# Patient Record
Sex: Female | Born: 1965 | Race: White | Hispanic: No | Marital: Married | Smoking: Never smoker
Health system: Southern US, Community
[De-identification: ages and names within clinical notes are randomized; demographics above are authoritative.]

## PROBLEM LIST (undated history)

## (undated) DIAGNOSIS — M1990 Unspecified osteoarthritis, unspecified site: Secondary | ICD-10-CM

## (undated) DIAGNOSIS — F988 Other specified behavioral and emotional disorders with onset usually occurring in childhood and adolescence: Secondary | ICD-10-CM

## (undated) DIAGNOSIS — G2581 Restless legs syndrome: Secondary | ICD-10-CM

## (undated) DIAGNOSIS — M797 Fibromyalgia: Secondary | ICD-10-CM

## (undated) DIAGNOSIS — T7840XA Allergy, unspecified, initial encounter: Secondary | ICD-10-CM

## (undated) HISTORY — PX: ABDOMINAL HYSTERECTOMY: SHX81

## (undated) HISTORY — DX: Restless legs syndrome: G25.81

## (undated) HISTORY — DX: Allergy, unspecified, initial encounter: T78.40XA

## (undated) HISTORY — DX: Unspecified osteoarthritis, unspecified site: M19.90

## (undated) HISTORY — PX: NOSE SURGERY: SHX723

## (undated) HISTORY — PX: NEURECTOMY FOOT: SUR888

## (undated) HISTORY — DX: Other specified behavioral and emotional disorders with onset usually occurring in childhood and adolescence: F98.8

## (undated) HISTORY — PX: SHOULDER ARTHROSCOPY: SHX128

## (undated) HISTORY — DX: Fibromyalgia: M79.7

---

## 1998-09-06 ENCOUNTER — Encounter (HOSPITAL_COMMUNITY): Admission: RE | Admit: 1998-09-06 | Discharge: 1998-12-05 | Payer: Self-pay | Admitting: Psychiatry

## 1998-11-26 ENCOUNTER — Emergency Department (HOSPITAL_COMMUNITY): Admission: EM | Admit: 1998-11-26 | Discharge: 1998-11-26 | Payer: Self-pay | Admitting: Emergency Medicine

## 2000-01-28 ENCOUNTER — Other Ambulatory Visit: Admission: RE | Admit: 2000-01-28 | Discharge: 2000-01-28 | Payer: Self-pay | Admitting: Obstetrics and Gynecology

## 2000-03-21 ENCOUNTER — Encounter (INDEPENDENT_AMBULATORY_CARE_PROVIDER_SITE_OTHER): Payer: Self-pay

## 2000-03-21 ENCOUNTER — Other Ambulatory Visit: Admission: RE | Admit: 2000-03-21 | Discharge: 2000-03-21 | Payer: Self-pay | Admitting: Obstetrics and Gynecology

## 2000-04-04 ENCOUNTER — Other Ambulatory Visit: Admission: RE | Admit: 2000-04-04 | Discharge: 2000-04-04 | Payer: Self-pay | Admitting: Gastroenterology

## 2000-12-03 ENCOUNTER — Encounter (INDEPENDENT_AMBULATORY_CARE_PROVIDER_SITE_OTHER): Payer: Self-pay

## 2000-12-04 ENCOUNTER — Inpatient Hospital Stay (HOSPITAL_COMMUNITY): Admission: AD | Admit: 2000-12-04 | Discharge: 2000-12-05 | Payer: Self-pay | Admitting: Obstetrics and Gynecology

## 2009-05-22 ENCOUNTER — Encounter: Admission: RE | Admit: 2009-05-22 | Discharge: 2009-05-22 | Payer: Self-pay | Admitting: Gastroenterology

## 2011-03-08 NOTE — H&P (Signed)
[facility]  Patient:    Morgan Adkins, Morgan Adkins                          MRN: [id number] Adm. Date:  12/03/00 Dictator:   Lenoard Aden, M.D. [facility]   History and Physical  CHIEF COMPLAINT:              Worsening dysmenorrhea, menorrhagia and symptomatic uterine fibroids.  HISTORY OF PRESENT ILLNESS:   The patient is a 45 year old white female G3, P2, with a history of depression, deep dyspareunia and menorrhagia, dysmenorrhea, who presents for definitive therapy.  She has had a long-standing history of worsening dysmenorrhea and menorrhagia which improved on birth control pills over a six- to eight-month period.  However, her hormonal contraception made her depression more refractory to treatment despite numerous changes in her antidepressive therapy.  She had to discontinue birth control pills because of worsening of her depression.  She continues with symptomatic discomfort to include menorrhagia and dysmenorrhea. At this time she has known uterine fibroids in addition to a family history of endometriosis.  PAST MEDICAL HISTORY:         Remarkable for anemia, heartburn, PMS, remote history of Chlamydia infection, and septoplasty, stomach surgery for reflux in 1996.  FAMILY HISTORY:               She has a family history of endometriosis and uterine cancer.  SOCIAL HISTORY:               She is married, living with her spouse and children.  She is a Community education officer.  MEDICATION HISTORY:           Includes the use of Celexa, Allegra and previously Mircette which has been discontinued.  ALLERGIES:                    She has allergies to CODEINE.  PREGNANCY HISTORY:            Remarkable for two uncomplicated vaginal deliveries.  REVIEW OF SYSTEMS:            Otherwise negative.  PHYSICAL EXAMINATION:  GENERAL:                      She is a well-developed, well-nourished white female in no apparent distress.  VITAL SIGNS:                   Blood pressure of 103/64, weight of 157 pounds, height of 5 feet 4 inches.  HEENT:                        Normal.  LUNGS:                        Clear.  HEART:                        Regular rate and rhythm.  ABDOMEN:                      Soft, scaphoid and nontender.  PELVIC EXAM:                  Reveals a retroflexed uterus which is enlarged and irregular.  No adnexal masses are appreciated.  EXTREMITIES:  Reveal no cords.  NEUROLOGIC EXAM:              Nonfocal.  IMPRESSION:                   1. Refractory dysmenorrhea, menorrhagia with                                  worsening dyspareunia.                               2. Depression.  The patient not a candidate for                                  hormonal therapy due to exacerbation of                                  depression.  PLAN:                         The plan is to proceed with definitive therapy in the form of a laparoscopically-assisted vaginal hysterectomy at [facility] or possible [facility].  The risks of anesthesia, infection, bleeding, injury to abdominal organs with the need for repair is discussed.  The patient acknowledges and desires to proceed. DD:  12/03/00 TD:  12/03/00 Job: [id number]

## 2011-03-08 NOTE — Discharge Summary (Signed)
[facility]  Patient:    Morgan Adkins, Morgan Adkins                          MRN: [id number] Adm. Date:  12/03/00 Disc. Date: 12/05/00 Attending:  Lenoard Aden, M.D.                           Discharge Summary  ADMISSION DIAGNOSES:          Refractory dysmenorrhea, menorrhagia, and uterine enlargement.  DISCHARGE DIAGNOSES:          Probable adenomyosis and enterocele.  HOSPITAL COURSE:              Patient underwent uncomplicated LAVH, [place] Nora on December 03, 2000.  Postoperative hemoglobin 9.1, hematocrit 26.7.  Tolerated a regular diet well postoperative day #1.  Discharged on day #2.  Given Darvocet-N 100 for pain #30, to take iron sulfate b.i.d.  Follow-up in the office in four weeks.  Discharge teaching done. DD:  12/15/00 TD:  12/15/00 Job: [id number]

## 2011-03-08 NOTE — Op Note (Signed)
[facility]  Patient:    Morgan Adkins, Morgan Adkins                          MRN: [id number] Proc. Date: 12/03/00 Attending:  Lenoard Aden, M.D. CC:         [facility]   Operative Report  PREOPERATIVE DIAGNOSIS:       Refractory dysmenorrhea, menometrorrhagia and uterine enlargement.  POSTOPERATIVE DIAGNOSIS:      Probable adenomyosis and enterocele.  PROCEDURE:                    Laparoscopically assisted vaginal hysterectomy and McCall culdoplasty.  SURGEON:                      Lenoard Aden, M.D.  ASSISTANT:                    Marina Gravel, M.D.  ANESTHESIA:                   General.  ESTIMATED BLOOD LOSS:         200 cc.  DRAINS:                       Foley and vaginal pack.  COUNTS:                       Correct.  DISPOSITION:                  Patient to recovery in good condition.  DESCRIPTION OF PROCEDURE:     After being apprised of the risks of anesthesia, infection, bleeding, injury to abdominal organs with the need for repair and the possibility of bowel or bladder injury with the need for repair, the patient was brought to the operating room, where she was placed on the operating room table and administered general anesthetic without complications.  The feet were placed in the yellow fin stirrups.  The patient was prepped and draped in the usual sterile fashion and a Foley catheter placed.  At this time, examination under anesthesia revealed a boggy, mid positioned, retroflexed uterus and no adnexal masses.  A Hulka tenaculum was placed per vagina on the cervix and attention was turned to the abdominal portion of the procedure, whereby an infraumbilical incision was made with a scalpel after placement of a dilute Marcaine solution.  A Veress needle was placed.  Negative drop test was noted.  Opening pressure of -2 was noted.  The patient pressure was set to 25 and 4 L of CO2 were insufflated without difficulty.  Good  pneumoperitoneum was formed with dullness noted to percussion in all four quadrants.  At this time, the Veress needle was removed and a trocar was placed with visualization and placement of the camera and scope revealing atraumatic entry.  Pictures were taken.  Normal liver and gallbladder bed and a normal appendix.  A large, enlarged, boggy, retroflexed uterus.  Normal tubes and ovaries.  The ureters were then identified bilaterally before proceeding.  The anterior and posterior cul-de-sac appeared normal.  Two incisions were made, each in the right and left lower quadrants and 5 mm trocars were placed under direct visualization.  Tripolar cautery was used to grasp the round ligaments bilaterally.  These were cauterized and divided.  The tubo-ovarian round ligament complexes were then cauterized and divided using the tripolar.  The uterine vessels were skeletonized bilaterally.  Progressive bites down the broad ligaments were taken bilaterally and these pedicles were cauterized, noting the ureters throughout the process.  A bladder flap was then developed sharply using scissors and a grasper.  Good hemostasis was achieved.  Attention was then turned to the vaginal portion of the procedure, whereby a weighted speculum was placed. Dilute Pitressin solution was infiltrated at the cervicovaginal junction circumferentially and incision of the cervicovaginal junction was made using electrocautery.  At this time, posterior cul-de-sac entry was made without difficulty.  A long weighted retractor was placed.  Anterior cul-de-sac entry was made, as well atraumatically.  Deaver retractor was placed.  At this time, a curved Heaney clamp was used to grasp the uterosacral ligaments bilaterally.  These were clamped, suture ligated and then transfixed to the vaginal cuff. Progressive pedicles were developed over the cardinal and broad ligament complexes.  The uterine vessels were also skeletonized, clamped  and suture ligated, all using 0 Vicryl suture.  The specimen was removed.  At this time, the cuff was inspected and found to be hemostatic.  An enterocele was identified.  Internal and external McCall culdoplasty sutures were placed in the standard fashion using 0 Vicryl suture.  Good hemostasis was noted.  The cuff was closed using 0 Vicryl interrupted mattress sutures.  Packing was placed.  Attention was then turned to the abdominal portion of the procedure. Irrigation was accomplished using a Nezhat after reinsufflating the abdomen with CO2.  Good hemostasis was noted along the vaginal cuff and all pedicles were inspected and found to be hemostatic.  Pictures were taken.  The ovaries appeared normal.  At this time, all instruments were removed under direct visualization.  CO2 was released.  The incisions were closed using 0 Vicryl in the infraumbilical incision and then Dermabond on the skin of all three incisions.  Urine appeared clear.  The patient tolerated this procedure well and was transferred to the recovery room on good condition. DD:  12/03/00 TD:  12/03/00 Job: [id number]

## 2012-03-18 ENCOUNTER — Other Ambulatory Visit (HOSPITAL_COMMUNITY): Payer: Self-pay | Admitting: Rheumatology

## 2012-03-18 ENCOUNTER — Ambulatory Visit (HOSPITAL_COMMUNITY)
Admission: RE | Admit: 2012-03-18 | Discharge: 2012-03-18 | Disposition: A | Discharge status: Home / Self Care | Payer: BC Managed Care – PPO | Source: Ambulatory Visit | Attending: Rheumatology | Admitting: Rheumatology

## 2012-03-18 DIAGNOSIS — Q677 Pectus carinatum: Secondary | ICD-10-CM | POA: Insufficient documentation

## 2012-03-18 DIAGNOSIS — D869 Sarcoidosis, unspecified: Secondary | ICD-10-CM | POA: Insufficient documentation

## 2013-09-30 ENCOUNTER — Ambulatory Visit (INDEPENDENT_AMBULATORY_CARE_PROVIDER_SITE_OTHER): Payer: Federal, State, Local not specified - PPO | Admitting: Podiatrist

## 2013-09-30 ENCOUNTER — Encounter: Payer: Self-pay | Admitting: Podiatrist

## 2013-09-30 ENCOUNTER — Ambulatory Visit (INDEPENDENT_AMBULATORY_CARE_PROVIDER_SITE_OTHER): Payer: Federal, State, Local not specified - PPO

## 2013-09-30 VITALS — BP 98/62 | HR 69 | Resp 16 | Ht 64.0 in | Wt 140.0 lb

## 2013-09-30 DIAGNOSIS — R52 Pain, unspecified: Secondary | ICD-10-CM

## 2013-09-30 DIAGNOSIS — M7750 Other enthesopathy of unspecified foot: Secondary | ICD-10-CM

## 2013-09-30 DIAGNOSIS — M7150 Other bursitis, not elsewhere classified, unspecified site: Secondary | ICD-10-CM

## 2013-09-30 NOTE — Progress Notes (Signed)
Subjective: Morgan Adkins is a well-known patient of mine who presents today for pain third interspace of the right foot. I previously performed a neuroma excision bilaterally third interspaces approximately 2 years ago. She never went on a fully healed these areas subjectively however the left foot has finally improved.  Her swelling has also finally improved and now she has pain third interspace right foot. She has been diagnosed with rheumatoid arthritis as well as fibromyalgia and is on medication.  Objective: Minimal swelling noted third interspace of the right foot discomfort at the third interspace of the right foot is also noted and palpated. Mild contracture of digits 3 and 4 of the left foot is noted and numbness between digits 3 and 4 left is also present. This however is not bothersome to the patient. Neurovascular status is intact bilaterally. Excellent appearance of incision site with complete healing is noted.  Assessment: Bursitis/capsulitis third interspace right foot  Plan: Discussed trying cortisone injections in order to try and reduce the swelling and symptomatology. The patient agreed and this was carried out today under sterile technique with Kenalog and Marcaine mixture. The patient tolerated his very well. I also discussed a topical medication to help at home with reduction in pain and inflammation. This will be ordered for her through [facility] I will see her back in 3-4 weeks for recheck in another injection may be warranted  Marlowe Aschoff, DPM

## 2013-10-29 ENCOUNTER — Ambulatory Visit: Payer: Federal, State, Local not specified - PPO | Admitting: Podiatrist

## 2013-11-01 ENCOUNTER — Encounter: Payer: Self-pay | Admitting: Podiatrist

## 2013-11-01 IMAGING — CR DG CHEST 2V
2 series · 2 of 2 positions shown · non-contrast
Comparison: None.

CLINICAL DATA: History of sarcoidosis.

CHEST - 2 VIEW

[w chest pa]
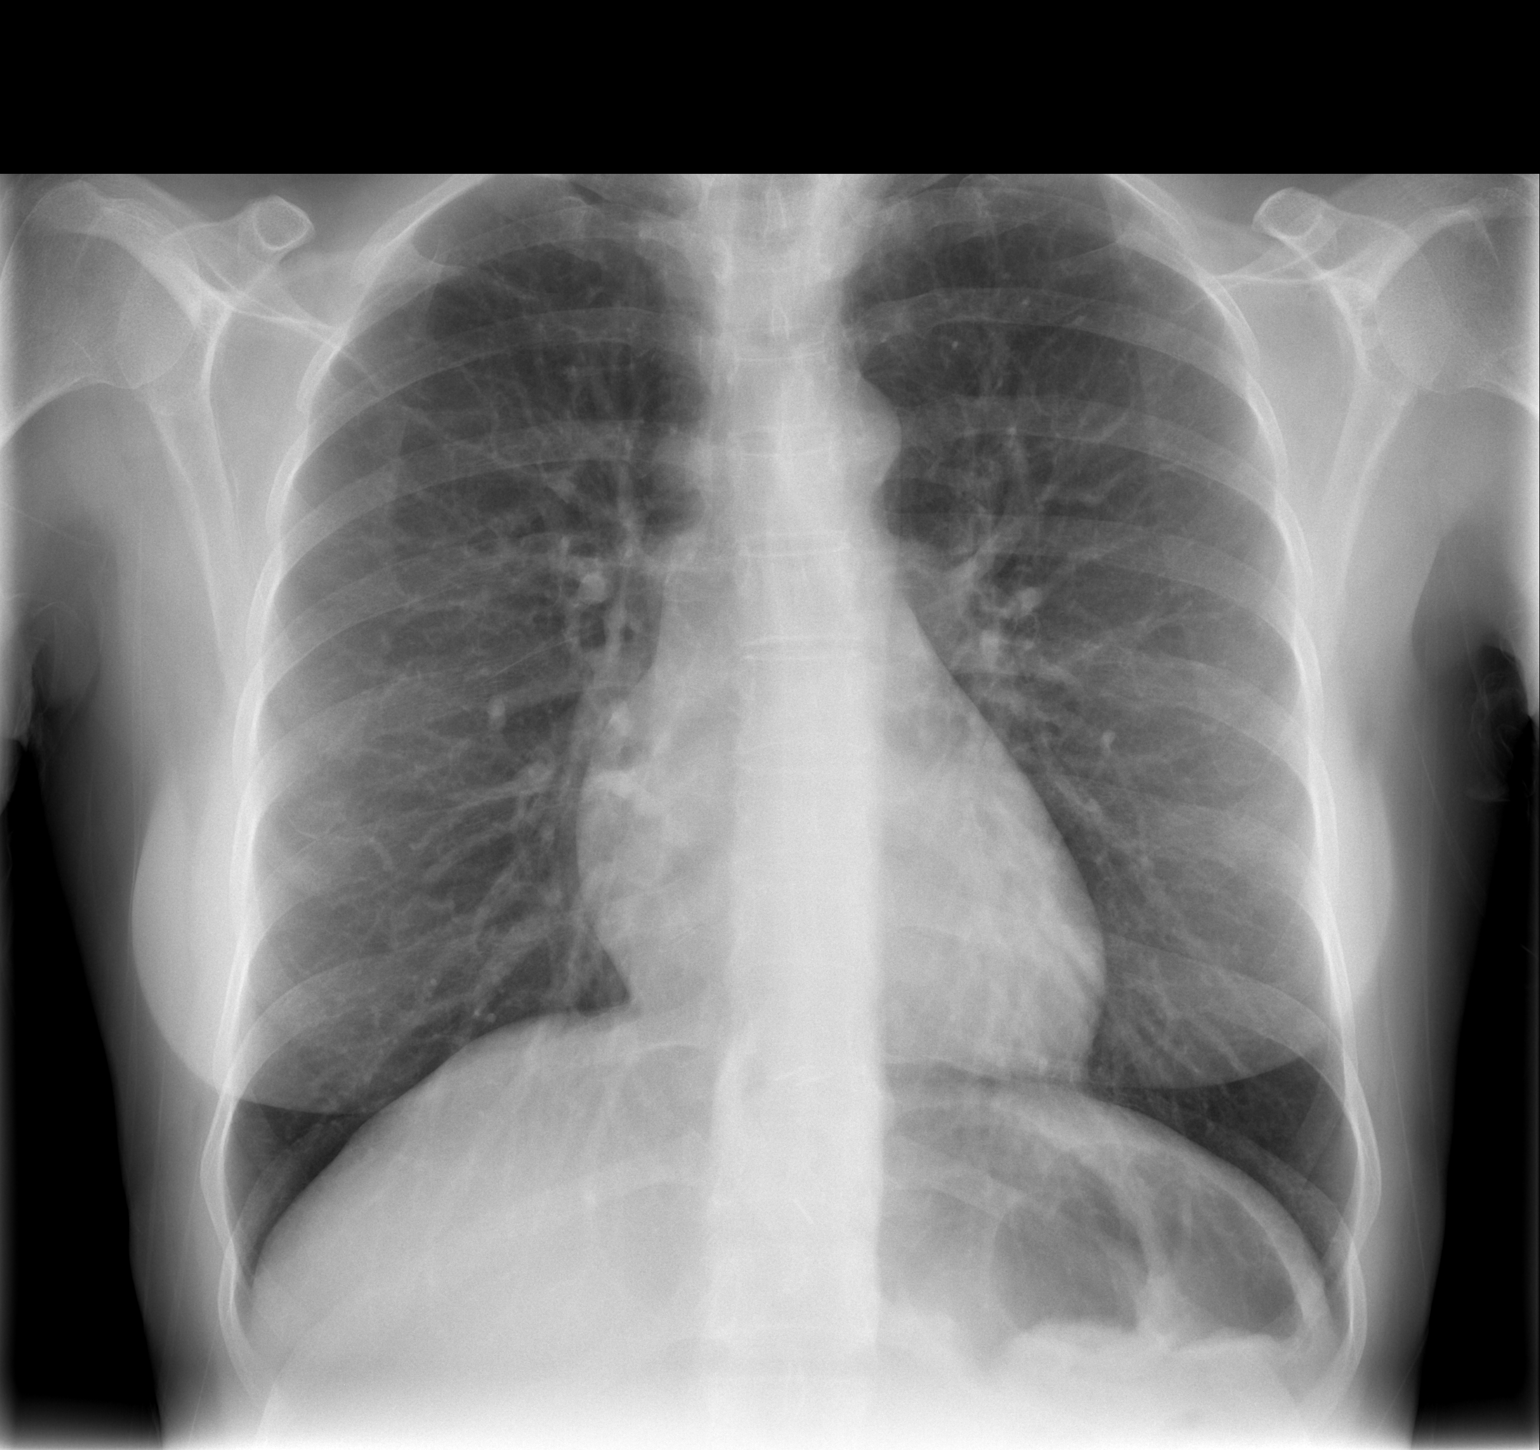

[w chest lat]
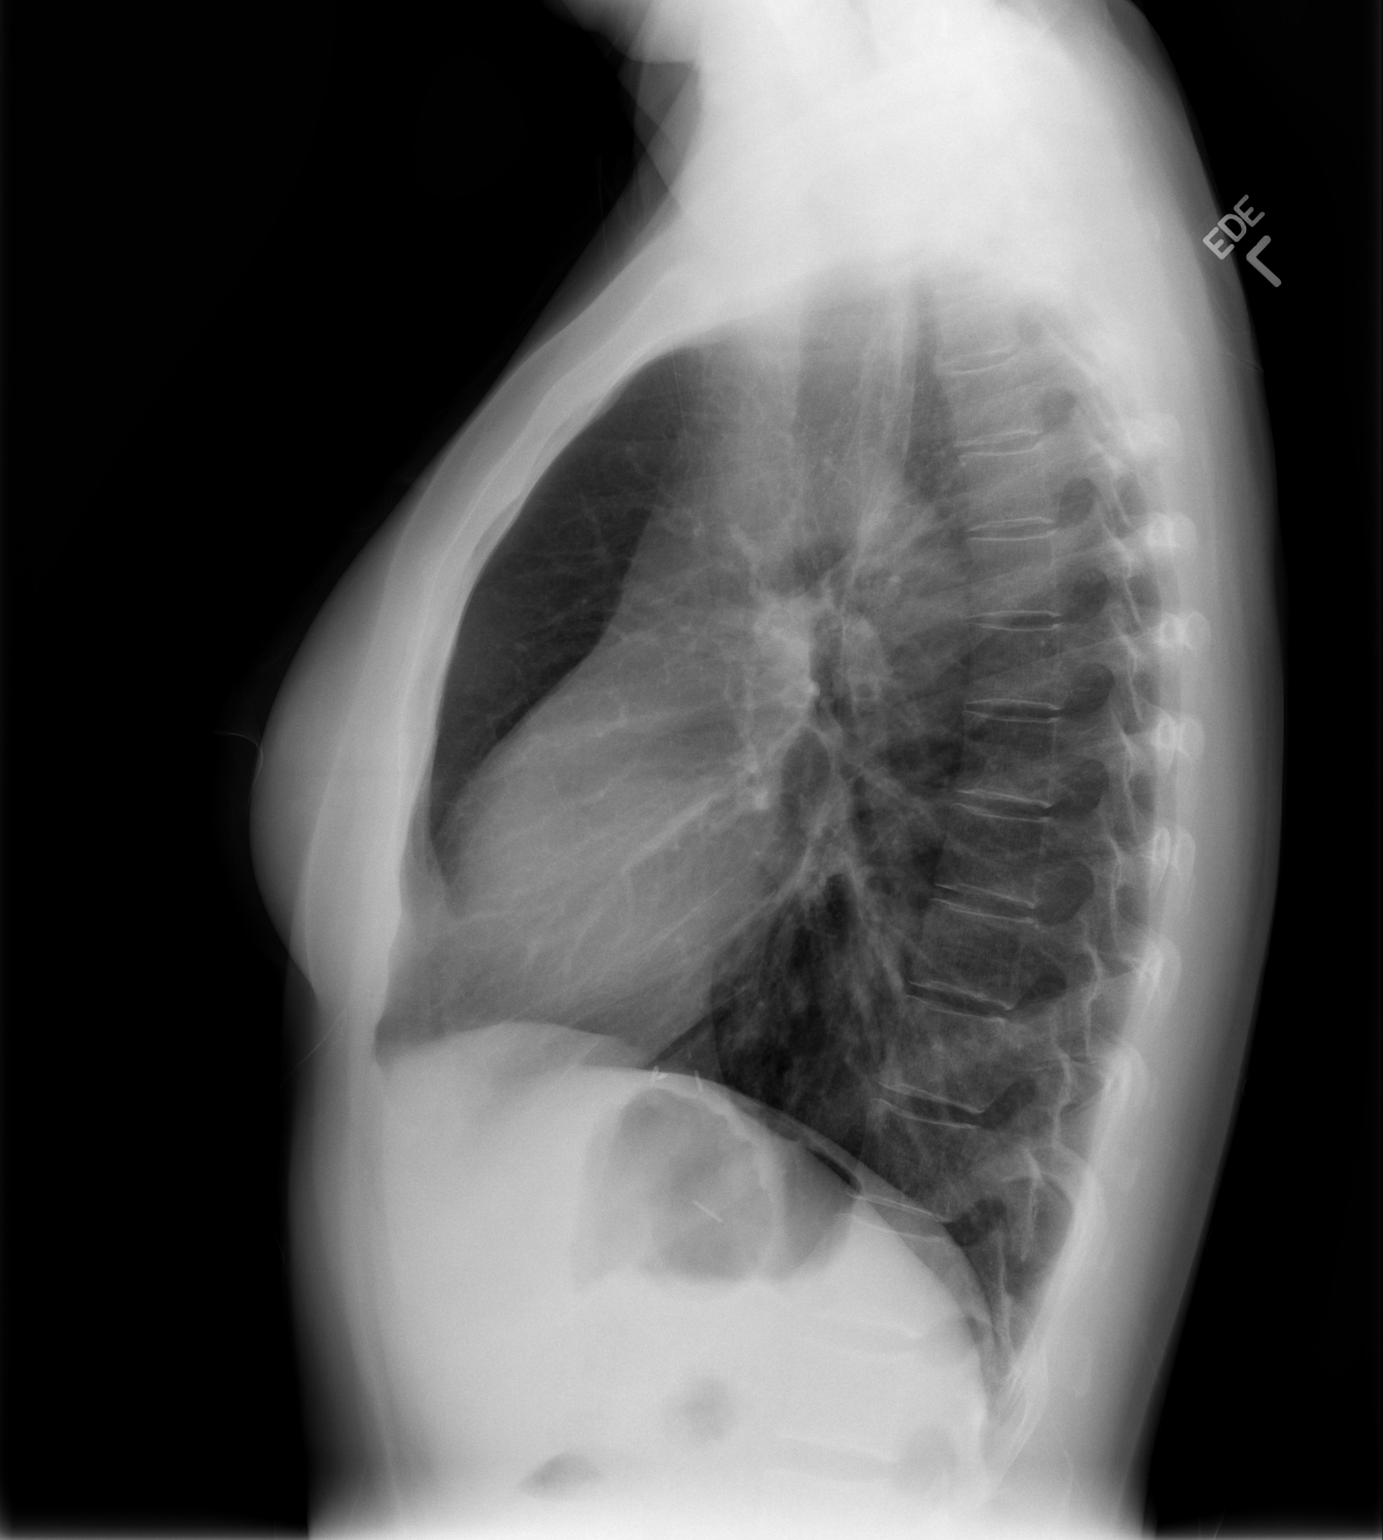

[2 of 2 positions shown; findings below may reference images not displayed]

FINDINGS: The cardiac silhouette is normal size and shape.
Mediastinal and hilar contours appear stable.  No pulmonary
infiltrates or nodules were evident. No pleural abnormality is
evident.  There is a mild pectus carinatum.
IMPRESSION: No acute or active cardiopulmonary or pleural abnormalities are
evident.  No adenopathy is seen.

## 2013-11-01 NOTE — Progress Notes (Signed)
Dr Irving ShowsEgerton prescribed Combination pain/Inflammation cream ([id number])-ailed Back Syndrome - Radiculopathy - Fibromyalgia containing Baclofen 2%, Cyclobenzaprine 2%, Diclofenac 3%, and Gabapentin 6%, Ketamine 10%, Lidocaine 3%,  dispensed 120 grams with 2 refills faxed to [facility] [phone].

## 2013-11-12 ENCOUNTER — Ambulatory Visit (INDEPENDENT_AMBULATORY_CARE_PROVIDER_SITE_OTHER): Payer: Federal, State, Local not specified - PPO | Admitting: Podiatrist

## 2013-11-12 ENCOUNTER — Encounter: Payer: Self-pay | Admitting: Podiatrist

## 2013-11-12 VITALS — BP 96/63 | HR 67 | Resp 18

## 2013-11-12 DIAGNOSIS — M7150 Other bursitis, not elsewhere classified, unspecified site: Secondary | ICD-10-CM

## 2013-11-12 DIAGNOSIS — M7750 Other enthesopathy of unspecified foot: Secondary | ICD-10-CM

## 2013-11-12 MED ORDER — TRIAMCINOLONE ACETONIDE 10 MG/ML IJ SUSP
10.0000 mg | Freq: Once | INTRAMUSCULAR | Status: AC
  Administered 2013-11-12: 10 mg

## 2013-11-12 NOTE — Progress Notes (Signed)
I am better than I was but there is still a little bit of pain on the ball of the right foot near the 4th and 5th   Subjective: Morgan Adkins presents today for followup of pain near the fourth metatarsal of the right foot. She states that the last injection was very beneficial and that her pain subsided significantly. She still has some discomfort directly over the fourth metatarsal head right. He previously did a neurectomy third interspace right foot she continues to have discomfort following the surgery.  Objective: Neurovascular status is intact and unchanged. Continued discomfort at the fourth metatarsal head right is present. It has subsided significantly from the previous injection.  Assessment: Capsulitis bursitis right fourth metatarsal  Plan: Discussed a second injection into the area of maximal discomfort and the patient wishes to proceed. A sterile prep was performed and Kenalog and Marcaine mixture was infiltrated into the area of maximal tenderness submetatarsal 4 right foot. The patient was given instructions for offloading and aftercare and she'll be seen back as needed for followup.

## 2013-11-12 NOTE — Patient Instructions (Signed)
Continue using the cream.  If it isn't feeling better in 2 weeks, call the pharmacy that sent you the cream and ask if they can send you a stronger formulation-- it usually takes 4 weeks to reach maximal benefit.  Call me if your foot continues to give you trouble after using the cream for 2-3 months.
